# Patient Record
Sex: Male | Born: 1990 | Race: Black or African American | Hispanic: No | Marital: Single | Smoking: Current some day smoker
Health system: Southern US, Community
[De-identification: ages and names within clinical notes are randomized; demographics above are authoritative.]

## PROBLEM LIST (undated history)

## (undated) DIAGNOSIS — J45909 Unspecified asthma, uncomplicated: Secondary | ICD-10-CM

## (undated) HISTORY — PX: KNEE ARTHROSCOPY: SUR90

---
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
## 2010-08-24 NOTE — Op Note (Signed)
NAMECARMINO, OCAIN                 ACCOUNT NO.:  [id number]   MEDICAL RECORD NO.:  [id number]          PATIENT TYPE:  AMB   LOCATION:  DSC                          FACILITY:  [facility]   PHYSICIAN:  Loreta Ave, M.D. DATE OF BIRTH:  Jun 23, 1990   DATE OF PROCEDURE:  02/14/2007  DATE OF DISCHARGE:                               OPERATIVE REPORT   PREOPERATIVE DIAGNOSIS:  Right knee anterior cruciate ligament tear with  anterolateral rotary instability.  Lateral meniscus tear.   POSTOPERATIVE DIAGNOSIS:  Right knee anterior cruciate ligament tear  with anterolateral rotary instability.  Lateral meniscus complex tear  irreparable.   PROCEDURE:  Right knee exam under anesthesia, arthroscopy, partial  lateral meniscectomy.  Arthroscopic endoscopic anterior cruciate  ligament reconstruction patellar tendon autograft, bone tendon bone.  Notchplasty.  Bioabsorbable screw fixation.   SURGEON:  Loreta Ave, MD   ASSISTANT:  Zonia Kief, PA present throughout the entire case.   ANESTHESIA:  General.   BLOOD LOSS:  Minimal.   SPECIMENS:  None.   CULTURES:  None.   COMPLICATIONS:  None.   PROCEDURE:  Soft compressive with knee immobilizer.   TOURNIQUET TIME:  90 minutes.   PROCEDURE:  The patient brought to the operating room, placed on  operating table in supine position.  After adequate anesthesia been  obtained, right knee examined.  Full motion.  Positive Lachman, positive  drawer, positive pivot shift.  PCL collaterals stable.  Patellofemoral  joint stable.  Tourniquet applied.  Prepped and draped in usual sterile  fashion.  Exsanguinated with elevation and Esmarch tourniquet to 350  mmHg.  Three portals created, one superolateral, one each medial and  lateral parapatellar.  Inflow catheter introduced, knee distended,  arthroscope introduced, knee inspected.  Articular cartilage intact  throughout.  Good patellofemoral tracking.  Complete mid substance  irreparable ACL  tear.  Moderately narrow notch.  PCL intact.  Medial  meniscus, medial compartment normal.  Lateral meniscus marked complex  tearing with numerous radial tears throughout, as well as peripheral  tears.  Thoroughly assessed completely irreparable.  Saucerized out  leaving a little bit of the rim in the back tapered into remaining  meniscus, salvaging most of the anterior half.  Notch cleared.  Notchplasty with shaver and high-speed bur.  Instruments and fluid  removed.  Anterior incision patella to tibial tubercle.  Middle third  patellar tendon harvested with bone pegs at either end.  Defect closed  with 0 Vicryl.  Graft prepared for 10-mm tunnels.  Arthroscope  reintroduced.  Small incision medial to the tibial tubercle.  K-wire  driven exiting out to the middle of the footprint of the ACL on the  tibia.  Overdrilled with a 10-mm reamer.  Debris cleared with shaver.  Femoral guide inserted across tibial tunnel notch and out back cortex of  femur.  K-wire driven.  Overdrilled with 10-mm reamer for appropriate  depth of the graft and pegs.  All recess examined, all debris cleared.  Both tunnels assessed and found to be in good position.  Two pin passer  inserted across both tunnels  out through a stab wound anterolateral  side. Nitinol wire brought in the medial portal and out to the femoral  tunnel.  Graft attached to two pin passer brought in across the knee  seating the pegs well in both the tibial and femoral tunnels.  Firmly  fixed at both ends with isometric tensioning technique at 70 degrees of  flexion placing bioabsorbable screws above and below.  9 x 25 mm above,  9 x 25 mm below.  Good capture and fixation.  All nitinol wire sutures  then removed.  Knee examined.  Full motion.  No impingement of the graft  when viewed through full motion.  Negative Lachman, negative drawer with  nice firm endpoint.  Wounds were all irrigated.  Incisions closed with  subcutaneous and subcuticular  Vicryl.  Portals closed with nylon.  Sterile compressive dressing applied after knee injected with Marcaine.  Tourniquet deflated and removed.  Knee immobilizer applied.  Anesthesia  reversed.  Brought to recovery room.  Tolerated surgery well.  No  complications.      Loreta Ave, M.D.  Electronically Signed     DFM/MEDQ  D:  02/14/2007  T:  02/15/2007  Job:  [id number]

## 2011-01-18 LAB — POCT HEMOGLOBIN-HEMACUE
Hemoglobin: 15.6
Operator id: 123881

## 2012-02-26 ENCOUNTER — Emergency Department (HOSPITAL_COMMUNITY): Payer: Self-pay

## 2012-02-26 ENCOUNTER — Encounter (HOSPITAL_COMMUNITY): Payer: Self-pay

## 2012-02-26 ENCOUNTER — Emergency Department (HOSPITAL_COMMUNITY)
Admission: EM | Admit: 2012-02-26 | Discharge: 2012-02-26 | Disposition: A | Discharge status: Home / Self Care | Payer: Self-pay | Attending: Emergency Medicine | Admitting: Emergency Medicine

## 2012-02-26 DIAGNOSIS — R51 Headache: Secondary | ICD-10-CM | POA: Insufficient documentation

## 2012-02-26 DIAGNOSIS — T148XXA Other injury of unspecified body region, initial encounter: Secondary | ICD-10-CM | POA: Insufficient documentation

## 2012-02-26 DIAGNOSIS — IMO0002 Reserved for concepts with insufficient information to code with codable children: Secondary | ICD-10-CM | POA: Insufficient documentation

## 2012-02-26 DIAGNOSIS — Z23 Encounter for immunization: Secondary | ICD-10-CM | POA: Insufficient documentation

## 2012-02-26 DIAGNOSIS — L089 Local infection of the skin and subcutaneous tissue, unspecified: Secondary | ICD-10-CM | POA: Insufficient documentation

## 2012-02-26 DIAGNOSIS — Y939 Activity, unspecified: Secondary | ICD-10-CM | POA: Insufficient documentation

## 2012-02-26 DIAGNOSIS — Y92410 Unspecified street and highway as the place of occurrence of the external cause: Secondary | ICD-10-CM | POA: Insufficient documentation

## 2012-02-26 DIAGNOSIS — S0180XA Unspecified open wound of other part of head, initial encounter: Secondary | ICD-10-CM | POA: Insufficient documentation

## 2012-02-26 MED ORDER — BACITRACIN ZINC 500 UNIT/GM EX OINT: TOPICAL_OINTMENT | Freq: Two times a day (BID) | CUTANEOUS | Status: DC

## 2012-02-26 MED ORDER — TETANUS-DIPHTH-ACELL PERTUSSIS 5-2.5-18.5 LF-MCG/0.5 IM SUSP
0.5000 mL | Freq: Once | INTRAMUSCULAR | Status: AC
  Administered 2012-02-26: 0.5 mL via INTRAMUSCULAR
  Filled 2012-02-26: qty 0.5

## 2012-02-26 MED ORDER — IBUPROFEN 600 MG PO TABS: 600.0000 mg | ORAL_TABLET | Freq: Four times a day (QID) | ORAL | Status: DC | PRN

## 2012-02-26 MED ORDER — TRAMADOL HCL 50 MG PO TABS: 50.0000 mg | ORAL_TABLET | Freq: Four times a day (QID) | ORAL | Status: DC | PRN

## 2012-02-26 MED ORDER — OXYCODONE-ACETAMINOPHEN 5-325 MG PO TABS
1.0000 | ORAL_TABLET | Freq: Once | ORAL | Status: AC
  Administered 2012-02-26: 1 via ORAL
  Filled 2012-02-26: qty 1
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
## 2012-02-26 NOTE — ED Notes (Signed)
Pt back from radiology.  AO x 4.

## 2012-02-26 NOTE — ED Notes (Signed)
Discharge instructions reviewed. Pt verbalized understanding.  

## 2012-02-26 NOTE — ED Provider Notes (Signed)
LACERATION REPAIR Performed by: Jaci Carrel Authorized by: Jaci Carrel Consent: Verbal consent obtained. Risks and benefits: risks, benefits and alternatives were discussed Consent given by: patient Patient identity confirmed: provided demographic data Prepped and Draped in normal sterile fashion Wound explored  Laceration Location: Forehead  Laceration Length: 3cm  No Foreign Bodies seen or palpated  Anesthesia: local infiltration  Local anesthetic: lidocaine 1% w epinephrine  Anesthetic total: 2 ml  Irrigation method: syringe Amount of cleaning: standard  Skin closure: 6.0 prolene  Number of sutures: 5  Technique: simple interuped  Patient tolerance: Patient tolerated the procedure well with no immediate complications.   Jaci Carrel, [place] 02/26/12 [phone]

## 2012-02-26 NOTE — ED Provider Notes (Signed)
I supervised this laceration repair.   Brandt Loosen, MD 02/26/12 [phone]

## 2012-02-26 NOTE — ED Notes (Signed)
Pt to CT and XRay
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
## 2013-11-06 ENCOUNTER — Emergency Department (HOSPITAL_COMMUNITY)
Admission: EM | Admit: 2013-11-06 | Discharge: 2013-11-06 | Disposition: A | Discharge status: Home / Self Care | Payer: Self-pay | Attending: Emergency Medicine | Admitting: Emergency Medicine

## 2013-11-06 ENCOUNTER — Encounter (HOSPITAL_COMMUNITY): Payer: Self-pay | Admitting: Emergency Medicine

## 2013-11-06 DIAGNOSIS — Z791 Long term (current) use of non-steroidal anti-inflammatories (NSAID): Secondary | ICD-10-CM | POA: Insufficient documentation

## 2013-11-06 DIAGNOSIS — L02419 Cutaneous abscess of limb, unspecified: Secondary | ICD-10-CM | POA: Insufficient documentation

## 2013-11-06 DIAGNOSIS — L03115 Cellulitis of right lower limb: Secondary | ICD-10-CM

## 2013-11-06 DIAGNOSIS — Z792 Long term (current) use of antibiotics: Secondary | ICD-10-CM | POA: Insufficient documentation

## 2013-11-06 DIAGNOSIS — M7989 Other specified soft tissue disorders: Secondary | ICD-10-CM | POA: Insufficient documentation

## 2013-11-06 DIAGNOSIS — L03119 Cellulitis of unspecified part of limb: Principal | ICD-10-CM

## 2013-11-06 MED ORDER — CEPHALEXIN 250 MG PO CAPS
500.0000 mg | ORAL_CAPSULE | Freq: Once | ORAL | Status: AC
  Administered 2013-11-06: 500 mg via ORAL
  Filled 2013-11-06: qty 2

## 2013-11-06 MED ORDER — CEPHALEXIN 500 MG PO CAPS: 500.0000 mg | ORAL_CAPSULE | Freq: Four times a day (QID) | ORAL | Status: DC

## 2013-11-06 NOTE — ED Notes (Signed)
Small pimple with large area of redness around it. Onset last pm. States started "picking at it" last pm.

## 2013-11-06 NOTE — ED Provider Notes (Signed)
Medical screening examination/treatment/procedure(s) were performed by non-physician practitioner and as supervising physician I was immediately available for consultation/collaboration.   EKG Interpretation None        William Shawnee Higham, MD 11/06/13 1531 

## 2013-11-06 NOTE — Discharge Instructions (Signed)
Please read and follow all provided instructions.  Your diagnoses today include:  1. Cellulitis of right lower extremity    Tests performed today include:  Vital signs. See below for your results today.   Medications prescribed:   Keflex (cephalexin) - antibiotic  You have been prescribed an antibiotic medicine: take the entire course of medicine even if you are feeling better. Stopping early can cause the antibiotic not to work.  Take any prescribed medications only as directed.   Home care instructions:  Follow any educational materials contained in this packet. Keep affected area above the level of your heart when possible. Wash area gently twice a day with warm soapy water. Do not apply alcohol or hydrogen peroxide. Cover the area if it draining or weeping.   Follow-up instructions: Return to the Emergency Department in 48 hours for a recheck if your symptoms are not significantly improved.   Return instructions:  Return to the Emergency Department if you have:  Fever  Worsening symptoms  Worsening pain  Worsening swelling  Redness of the skin that moves away from the affected area, especially if it streaks away from the affected area   Any other emergent concerns  Your vital signs today were: BP 134/74   Pulse 81   Temp(Src) 98.2 F (36.8 C) (Oral)   Resp 18   Ht 6' (1.829 m)   Wt 192 lb (87.091 kg)   BMI 26.03 kg/m2   SpO2 96% If your blood pressure (BP) was elevated above 135/85 this visit, please have this repeated by your doctor within one month. --------------

## 2013-11-06 NOTE — ED Notes (Signed)
Reports possible bug bite to right lower leg since yesterday.

## 2013-11-06 NOTE — ED Provider Notes (Signed)
CSN: [id number]     Arrival date & time 11/06/13  1305 History  This chart was scribed for non-physician practitioner, Renne Crigler, PA-C, working with Dagmar Hait, MD by Charline Bills, ED Scribe. This patient was seen in room TR05C/TR05C and the patient's care was started at 2:46 PM.   Chief Complaint  Patient presents with  . Insect Bite   The history is provided by the patient. No language interpreter was used.   HPI Comments: Kirk Rose is a 23 y.o. male who presents to the Emergency Department complaining of suspected insect bite to R lower leg noted last night. Pt reports associated gradually worsening pain, redness and warmth to the area. He denies fever, chills, nausea, vomiting. Pt has applied peroxide and alcohol with no relief.   History reviewed. No pertinent past medical history. History reviewed. No pertinent past surgical history. History reviewed. No pertinent family history. History  Substance Use Topics  . Smoking status: Not on file  . Smokeless tobacco: Not on file  . Alcohol Use: Yes     Comment: occasionally    Review of Systems  Constitutional: Negative for fever and chills.  Cardiovascular: Positive for leg swelling.  Gastrointestinal: Negative for nausea and vomiting.  Skin: Positive for color change and wound.   Allergies  Review of patient's allergies indicates no known allergies.  Home Medications   Prior to Admission medications   Medication Sig Start Date End Date Taking? Authorizing Provider  bacitracin ointment Apply topically 2 (two) times daily. 02/26/12   Brandt Loosen, MD  ibuprofen (ADVIL,MOTRIN) 600 MG tablet Take 1 tablet (600 mg total) by mouth every 6 (six) hours as needed for pain. 02/26/12   Brandt Loosen, MD  Pseudoeph-Doxylamine-DM-APAP (NYQUIL PO) Take 2 capsules by mouth every 6 (six) hours as needed. Cold symptoms    Historical Provider, MD  traMADol (ULTRAM) 50 MG tablet Take 1 tablet (50 mg total) by mouth every 6  (six) hours as needed for pain. 02/26/12   Brandt Loosen, MD   Triage Vitals: BP 134/74  Pulse 81  Temp(Src) 98.2 F (36.8 C) (Oral)  Resp 18  Ht 6' (1.829 m)  Wt 192 lb (87.091 kg)  BMI 26.03 kg/m2  SpO2 96%  Physical Exam  Nursing note and vitals reviewed. Constitutional: He appears well-developed and well-nourished.  HENT:  Head: Normocephalic and atraumatic.  Eyes: Conjunctivae are normal.  Neck: Normal range of motion. Neck supple.  Pulmonary/Chest: Effort normal. No respiratory distress.  Neurological: He is alert.  Skin: Skin is warm and dry.  Patients with 5 cm diameter area of erythema and warmth surrounding a small pustule on his right lateral ankle consistent with cellulitis. No palpable abscess.  Psychiatric: He has a normal mood and affect. His behavior is normal.   ED Course  Procedures (including critical care time) DIAGNOSTIC STUDIES: Oxygen Saturation is 96% on RA, adequate by my interpretation.    COORDINATION OF CARE: 2:51 PM-Discussed treatment plan which includes antibiotics and return precautions with pt at bedside and pt agreed to plan.   Labs Review Labs Reviewed - No data to display  Imaging Review No results found.   EKG Interpretation None      Vital signs reviewed and are as follows: Filed Vitals:   11/06/13 1323  BP: 134/74  Pulse: 81  Temp: 98.2 F (36.8 C)  Resp: 18   Pt urged to return with worsening pain, worsening swelling, expanding area of redness or streaking up extremity,  fever, or any other concerns. Urged to take complete course of antibiotics as prescribed.  Counseled to take NSAIDs for pain. Pt verbalizes understanding and agrees with plan.   MDM   Final diagnoses:  Cellulitis of right lower extremity   Patient with area of cellulitis on lower extremity. No systemic symptoms. Compartments of lower extremity are soft. Treat with Keflex. No injury to the area.  I personally performed the services described in this  documentation, which was scribed in my presence. The recorded information has been reviewed and is accurate.    Renne CriglerJoshua Annalynn Centanni, PA-C 11/06/13 1501

## 2014-11-30 ENCOUNTER — Emergency Department (HOSPITAL_COMMUNITY)
Admission: EM | Admit: 2014-11-30 | Discharge: 2014-11-30 | Disposition: A | Discharge status: Home / Self Care | Payer: Self-pay | Attending: Emergency Medicine | Admitting: Emergency Medicine

## 2014-11-30 ENCOUNTER — Encounter (HOSPITAL_COMMUNITY): Payer: Self-pay | Admitting: *Deleted

## 2014-11-30 DIAGNOSIS — Z8739 Personal history of other diseases of the musculoskeletal system and connective tissue: Secondary | ICD-10-CM | POA: Insufficient documentation

## 2014-11-30 DIAGNOSIS — Z792 Long term (current) use of antibiotics: Secondary | ICD-10-CM | POA: Insufficient documentation

## 2014-11-30 DIAGNOSIS — Z720 Tobacco use: Secondary | ICD-10-CM | POA: Insufficient documentation

## 2014-11-30 DIAGNOSIS — L03116 Cellulitis of left lower limb: Secondary | ICD-10-CM | POA: Insufficient documentation

## 2014-11-30 MED ORDER — SULFAMETHOXAZOLE-TRIMETHOPRIM 800-160 MG PO TABS: 1.0000 | ORAL_TABLET | Freq: Two times a day (BID) | ORAL | Status: AC

## 2014-11-30 NOTE — Discharge Instructions (Signed)
Please read and follow all provided instructions.  Your diagnoses today include:  1. Cellulitis of left lower extremity     Tests performed today include:  Vital signs. See below for your results today.   Medications prescribed:   Take any prescribed medications only as directed.   Home care instructions:   Follow any educational materials contained in this packet  Follow-up instructions: Return to the Emergency Department in 48 hours for a recheck if your symptoms are not significantly improved.  Return instructions:  Return to the Emergency Department if you have:  Fever  Worsening symptoms  Worsening pain  Worsening swelling  Redness of the skin that moves away from the affected area, especially if it streaks away from the affected area   Any other emergent concerns  Your vital signs today were: BP 138/77 mmHg   Pulse 77   Temp(Src) 98 F (36.7 C) (Oral)   Resp 16   Ht 6' (1.829 m)   Wt 195 lb (88.451 kg)   BMI 26.44 kg/m2   SpO2 100% If your blood pressure (BP) was elevated above 135/85 this visit, please have this repeated by your doctor within one month. --------------

## 2014-11-30 NOTE — ED Notes (Signed)
Possible spider bite to posterior ,upper LT thigh. Skin red swollen ,no drainage seen.

## 2014-11-30 NOTE — ED Notes (Signed)
Declined W/C at D/C and was escorted to lobby by RN. 

## 2014-11-30 NOTE — ED Provider Notes (Signed)
CSN: [id number]     Arrival date & time 11/30/14  0929 History  This chart was scribed for Renne Crigler, PA-C, working with Pricilla Loveless, MD by Elon Spanner, ED Scribe. This patient was seen in room TR10C/TR10C and the patient's care was started at 9:51 AM.   Chief Complaint  Patient presents with  . Insect Bite   The history is provided by the patient. No language interpreter was used.    HPI Comments: Kirk Rose is a 24 y.o. male who presents to the Emergency Department complaining of a suspected spider bite (not visualized) on the left upper thigh onset several days ago. Associated symptoms include a small area of surrounding redness but no drainage. No fever, N/V. Patient reports a hx of cellulitis on the right calf that required antibiotics. NKA.  Past Medical History  Diagnosis Date  . Arthritis childhood   Past Surgical History  Procedure Laterality Date  . Knee arthroscopy Right    History reviewed. No pertinent family history. Social History  Substance Use Topics  . Smoking status: Current Some Day Smoker    Types: Cigarettes  . Smokeless tobacco: Never Used  . Alcohol Use: Yes     Comment: occasionally    Review of Systems  Constitutional: Negative for fever.  Gastrointestinal: Negative for nausea and vomiting.  Skin: Positive for color change and wound.       Positive for abscess  Hematological: Negative for adenopathy.    Allergies  Review of patient's allergies indicates no known allergies.  Home Medications   Prior to Admission medications   Medication Sig Start Date End Date Taking? Authorizing Provider  cephALEXin (KEFLEX) 500 MG capsule Take 1 capsule (500 mg total) by mouth 4 (four) times daily. 11/06/13   Renne Crigler, PA-C   BP 138/77 mmHg  Pulse 77  Temp(Src) 98 F (36.7 C) (Oral)  Resp 16  Ht 6' (1.829 m)  Wt 195 lb (88.451 kg)  BMI 26.44 kg/m2  SpO2 100%   Physical Exam  Constitutional: He appears well-developed and  well-nourished.  HENT:  Head: Normocephalic and atraumatic.  Eyes: Conjunctivae are normal.  Neck: Normal range of motion. Neck supple.  Pulmonary/Chest: No respiratory distress.  Neurological: He is alert.  Skin: Skin is warm and dry. There is erythema.  Less then 1 cm pustule noted to left posterior thigh with 3-4 cm of surrounding erythema consistent with cellulitis.  Psychiatric: He has a normal mood and affect.  Nursing note and vitals reviewed.   ED Course  Procedures (including critical care time)  DIAGNOSTIC STUDIES: Oxygen Saturation is 100% on RA, normal by my interpretation.    COORDINATION OF CARE:  9:56 AM Discussed with patient that there was no indication for I&D at this time.  Will prescribe Bactrim.  Patient should soak the complaint and use warm compress.  Return precautions advised.  He may need to return if abscess forms. Patient acknowledges and agrees with plan.     EKG Interpretation None       Vital signs reviewed and are as follows: Filed Vitals:   11/30/14 0936  BP: 138/77  Pulse: 77  Temp: 98 F (36.7 C)  Resp: 16   The patient was urged to return to the Emergency Department urgently with worsening pain, swelling, expanding erythema especially if it streaks away from the affected area, fever, or if they have any other concerns.   The patient was urged to return to the Emergency Department or go  to their PCP in 48 hours for wound recheck if the area is not significantly improved.  The patient verbalized understanding and stated agreement with this plan.     MDM   Final diagnoses:  Cellulitis of left lower extremity   Patient with small pustule with associated cellulitis. Will start on Bactrim. Return instructions discussed as above.  I personally performed the services described in this documentation, which was scribed in my presence. The recorded information has been reviewed and is accurate.    Renne Crigler, PA-C 11/30/14  1019  Pricilla Loveless, MD 12/04/14 [phone]

## 2015-06-01 ENCOUNTER — Encounter (HOSPITAL_COMMUNITY): Payer: Self-pay | Admitting: *Deleted

## 2015-06-01 ENCOUNTER — Emergency Department (HOSPITAL_COMMUNITY)
Admission: EM | Admit: 2015-06-01 | Discharge: 2015-06-01 | Disposition: A | Discharge status: Home / Self Care | Payer: Self-pay | Attending: Emergency Medicine | Admitting: Emergency Medicine

## 2015-06-01 DIAGNOSIS — J069 Acute upper respiratory infection, unspecified: Secondary | ICD-10-CM | POA: Insufficient documentation

## 2015-06-01 DIAGNOSIS — J45909 Unspecified asthma, uncomplicated: Secondary | ICD-10-CM | POA: Insufficient documentation

## 2015-06-01 DIAGNOSIS — F17210 Nicotine dependence, cigarettes, uncomplicated: Secondary | ICD-10-CM | POA: Insufficient documentation

## 2015-06-01 HISTORY — DX: Unspecified asthma, uncomplicated: J45.909

## 2015-06-01 MED ORDER — FLUTICASONE PROPIONATE 50 MCG/ACT NA SUSP: 2.0000 | Freq: Every day | NASAL | Status: DC

## 2015-06-01 MED ORDER — BENZONATATE 100 MG PO CAPS: 100.0000 mg | ORAL_CAPSULE | Freq: Three times a day (TID) | ORAL | Status: DC

## 2015-06-01 NOTE — ED Notes (Signed)
Declined W/C at D/C and was escorted to lobby by RN. 

## 2015-06-01 NOTE — ED Notes (Signed)
Pt reports a productive cough and sore throat since Friday.

## 2015-06-01 NOTE — ED Provider Notes (Signed)
CSN: [id number]     Arrival date & time 06/01/15  0804 History  By signing my name below, I, Kirk Rose, attest that this documentation has been prepared under the direction and in the presence of [facility], PA-C. Electronically Signed: Ronney Rose, ED Scribe. 06/01/2015. 9:48 AM.    Chief Complaint  Patient presents with  . Cough  . Sore Throat   The history is provided by the patient. No language interpreter was used.    HPI Comments: Kirk Rose is a 25 y.o. male who presents to the Emergency Department complaining of a gradual-onset, constant, moderate, dry, hacking cough that began 3 days ago. He also notes associated congestion, sore throat, and chills. He states his cough is worse at night, which is interfering with his sleep. Patient notes a history of asthma as a child, but states he has not had any asthmatic symptoms in the past 10 years. He reports he had tried Mucinex with only minimal relief to his symptoms. He denies any known fever.   Past Medical History  Diagnosis Date  . Asthma childhood   Past Surgical History  Procedure Laterality Date  . Knee arthroscopy Right    History reviewed. No pertinent family history. Social History  Substance Use Topics  . Smoking status: Current Some Day Smoker    Types: Cigarettes  . Smokeless tobacco: Never Used  . Alcohol Use: Yes     Comment: occasionally    Review of Systems  Constitutional: Positive for chills. Negative for fever.  HENT: Positive for congestion and sore throat.   Respiratory: Positive for cough.    Allergies  Review of patient's allergies indicates no known allergies.  Home Medications   Prior to Admission medications   Medication Sig Start Date End Date Taking? Authorizing Provider  benzonatate (TESSALON) 100 MG capsule Take 1 capsule (100 mg total) by mouth every 8 (eight) hours. 06/01/15   Chase Picket Selene Peltzer, PA-C  fluticasone (FLONASE) 50 MCG/ACT nasal spray Place 2 sprays into both nostrils  daily. 06/01/15   Sarah Baez Pilcher Freddie Dymek, PA-C   BP 126/90 mmHg  Pulse 76  Temp(Src) 98 F (36.7 C) (Oral)  Resp 20  Ht 6' (1.829 m)  Wt 90.719 kg  BMI 27.12 kg/m2  SpO2 98% Physical Exam  Constitutional: He is oriented to person, place, and time. He appears well-developed and well-nourished. No distress.  HENT:  Head: Normocephalic and atraumatic.  Mouth/Throat: Posterior oropharyngeal erythema present. No oropharyngeal exudate.  Oropharynx with erythema. No exudates, no tonsillar hypertrophy. Nasal congestion.   Eyes: Conjunctivae and EOM are normal.  Neck: Neck supple. No tracheal deviation present.  Cardiovascular: Normal rate, regular rhythm and normal heart sounds.   Pulmonary/Chest: Effort normal and breath sounds normal. No respiratory distress. He has no wheezes. He has no rales. He exhibits no tenderness.  Lungs are clear to auscultation.   Abdominal: He exhibits no distension. There is no tenderness.  Musculoskeletal: Normal range of motion.  Lymphadenopathy:    He has cervical adenopathy.  Neurological: He is alert and oriented to person, place, and time.  Skin: Skin is warm and dry.  Psychiatric: He has a normal mood and affect. His behavior is normal.  Nursing note and vitals reviewed.   ED Course  Procedures (including critical care time)  DIAGNOSTIC STUDIES: Oxygen Saturation is 98% on RA, normal by my interpretation.    COORDINATION OF CARE: 9:38 AM - Patients symptoms are consistent with URI, likely viral etiology. Discussed that  antibiotics are not indicated for viral infections. Pt will be discharged with symptomatic treatment. Discussed treatment plan with pt at bedside which includes cough medication and Flonase. Advised OTC decongestant, lozenges, hot teas, and saltwater gargles. Pt is hemodynamically stable & in NAD prior to dc. Pt verbalized understanding and agreed to plan.   MDM   Final diagnoses:  URI (upper respiratory infection)   [name] is afebrile, non-toxic appearing with a clear lung exam. Mild rhinorrhea and OP with erythema, no exudates. Likely viral URI. Patient is agreeable to symptomatic treatment with close follow up with PCP as needed but spoke at length about emergent, changing, or worsening of symptoms that should prompt return to ER. Patient voices understanding and is agreeable to plan.   Blood pressure 126/90, pulse 76, temperature 98 F (36.7 C), temperature source Oral, resp. rate 20, height 6' (1.829 m), weight 90.719 kg, SpO2 98 %.  I personally performed the services described in this documentation, which was scribed in my presence. The recorded information has been reviewed and is accurate.  [facility] Krystin Keeven, PA-C 06/01/15 9629  Lorre Nick, MD 06/01/15 1330

## 2015-06-01 NOTE — Discharge Instructions (Signed)
1. Medications: flonase, decongestant over the counter, tessalon for cough, usual home medications 2. Treatment: rest, drink plenty of fluids, take tylenol or ibuprofen for fever control as needed 3. Follow Up: Please followup with your primary doctor in 3 days for discussion of your diagnoses and further evaluation after today's visit;  if you do not have a primary care doctor use the resource guide provided to find one; Return to the ER for high fevers, difficulty breathing or other concerning symptoms   Emergency Department Resource Guide  1) Find a Doctor and Pay Out of Pocket Although you won't have to find out who is covered by your insurance plan, it is a good idea to ask around and get recommendations. You will then need to call the office and see if the doctor you have chosen will accept you as a new patient and what types of options they offer for patients who are self-pay. Some doctors offer discounts or will set up payment plans for their patients who do not have insurance, but you will need to ask so you aren't surprised when you get to your appointment.  2) Contact Your Local Health Department Not all health departments have doctors that can see patients for sick visits, but many do, so it is worth a call to see if yours does. If you don't know where your local health department is, you can check in your phone book. The CDC also has a tool to help you locate your state's health department, and many state websites also have listings of all of their local health departments.  3) Find a Walk-in Clinic If your illness is not likely to be very severe or complicated, you may want to try a walk in clinic. These are popping up all over the country in pharmacies, drugstores, and shopping centers. They're usually staffed by nurse practitioners or physician assistants that have been trained to treat common illnesses and complaints. They're usually fairly quick and inexpensive. However, if you have  serious medical issues or chronic medical problems, these are probably not your best option.  No Primary Care Doctor: - Call Health Connect at  [phone]: they can help you locate a primary care doctor that  accepts your insurance, provides certain services, etc. - Physician Referral Service: [phone]  Chronic Pain Problems: Organization         Address  Phone   Notes  [facility]  [phone] Patients need to be referred by their primary care doctor.   Medication Assistance: Organization         Address  Phone   Notes  [facility] Medication [facility] [address] [place] [place], [place] [address] [phone] --Must be a resident of [facility] -- Must have NO insurance coverage whatsoever (no Medicaid/ Medicare, etc.) -- The pt. MUST have a primary care doctor that directs their care regularly and follows them in the community   [facility]  [phone]   [employer]  [phone]    Agencies that provide inexpensive medical care: Organization         Address  Phone   Notes  [facility]  [phone]   [facility]    [phone]   [facility] [address] [place], [place] [address] [phone]   [facility] [address] [place], [place] [phone]   [facility]anned [facility]    [phone]   [facility]    [phone]   [facility]  [address], [place] Phone:  [phone], Fax:  [phone] Hours of Operation:  9 am - 6 pm, M-F.  Also accepts Medicaid/Medicare and self-pay.  [facility]  [address], [place] Phone: [phone], Fax: [phone]. Hours of Operation:  8:30 am - 5:30 pm, M-F.  Also accepts Medicaid and self-pay.  [facility] [place] [address], [place] Phone: [phone]   [facility] [address] Natasha Bence [place], [place] [phone], Ext. [phone] Mondays & Thursdays: 7-9 AM.  First 15 patients are seen on a first come, first serve basis.    Medicaid-accepting [facility] Providers:  Organization         Address  Phone   Notes  [facility] [address], [place] [phone] Also accepts self-pay patients.  [facility] [address] Laurell Josephs [place], [place]  [phone]   [facility] [address], [place] [phone]   [facility] [address], [place] [phone]   Renaye Rakers [address], [place]   [phone] Only accepts [place] patients after they have their name applied to their card.   Self-Pay (no insurance) in [facility]:  Organization         Address  Phone   Notes  Sickle Cell Patients, [facility] [address] [place], [place] [phone]   [facility] [address] [place], [place] [phone]   [facility]  [address], [place] [phone]   [facility]/Dr. Osei-Bonsu  [address] [place] or [address], [place] [phone] Phone number for both [place] and [place] locations is the same.  [facility] [address], [place] [phone]   [facility] [address] [place] or [address] [phone]   [facility] [address] [place] [phone], phone; [phone], fax Sees patients 1st and 3rd Saturday of every month.  Must not qualify for public or private insurance (i.e. Medicaid, Medicare, [place] Health Choice, Veterans' Benefits)  Household income should be no more than 200% of the poverty level The clinic cannot treat you if you are pregnant or think you are pregnant   Sexually transmitted diseases are not treated at the clinic.

## 2016-12-19 ENCOUNTER — Encounter (HOSPITAL_COMMUNITY): Payer: Self-pay | Admitting: Emergency Medicine

## 2016-12-19 DIAGNOSIS — Z79899 Other long term (current) drug therapy: Secondary | ICD-10-CM | POA: Insufficient documentation

## 2016-12-19 DIAGNOSIS — F17210 Nicotine dependence, cigarettes, uncomplicated: Secondary | ICD-10-CM | POA: Insufficient documentation

## 2016-12-19 DIAGNOSIS — L723 Sebaceous cyst: Secondary | ICD-10-CM | POA: Insufficient documentation

## 2016-12-19 DIAGNOSIS — R51 Headache: Secondary | ICD-10-CM | POA: Insufficient documentation

## 2016-12-19 NOTE — ED Triage Notes (Signed)
[facility] and noticed two knots on the back of his head. Patient states it is causing discomfort and pain.

## 2016-12-20 ENCOUNTER — Emergency Department (HOSPITAL_COMMUNITY)
Admission: EM | Admit: 2016-12-20 | Discharge: 2016-12-20 | Disposition: A | Discharge status: Home / Self Care | Payer: Self-pay | Attending: Emergency Medicine | Admitting: Emergency Medicine

## 2016-12-20 DIAGNOSIS — R519 Headache, unspecified: Secondary | ICD-10-CM

## 2016-12-20 DIAGNOSIS — R51 Headache: Secondary | ICD-10-CM

## 2016-12-20 DIAGNOSIS — L723 Sebaceous cyst: Secondary | ICD-10-CM

## 2016-12-20 MED ORDER — KETOROLAC TROMETHAMINE 60 MG/2ML IM SOLN
60.0000 mg | Freq: Once | INTRAMUSCULAR | Status: AC
  Administered 2016-12-20: 60 mg via INTRAMUSCULAR
  Filled 2016-12-20: qty 2

## 2016-12-20 MED ORDER — IBUPROFEN 600 MG PO TABS: 600.0000 mg | ORAL_TABLET | Freq: Four times a day (QID) | ORAL | 0 refills | Status: DC | PRN

## 2016-12-20 MED ORDER — DOXYCYCLINE HYCLATE 100 MG PO CAPS: 100.0000 mg | ORAL_CAPSULE | Freq: Two times a day (BID) | ORAL | 0 refills | Status: AC

## 2016-12-20 MED ORDER — DOXYCYCLINE HYCLATE 100 MG PO TABS
100.0000 mg | ORAL_TABLET | Freq: Once | ORAL | Status: AC
  Administered 2016-12-20: 100 mg via ORAL
  Filled 2016-12-20: qty 1

## 2016-12-20 NOTE — Discharge Instructions (Signed)
We believe that your headache is brought on by presence of a sebaceous cyst to your scalp. One of the cysts may be infected which can cause it to be tender. We advise that you take doxycycline to prevent worsening symptoms and worsening infection. Take ibuprofen as prescribed for pain management. You may try applying warm compresses to the area to promote drainage. Follow-up with a primary care doctor if able. You may return to the emergency department for new or concerning symptoms.

## 2016-12-30 NOTE — ED Provider Notes (Signed)
[facility]-EMERGENCY DEPT Provider Note   CSN: [id number] Arrival date & time: 12/19/16  1950     History   Chief Complaint Chief Complaint  Patient presents with  . Headache    HPI Kirk Rose is a 26 y.o. male.  26 year old male presents to the emergency department for evaluation of knots to the back of his head. He states that he has had these over the past few days. They have been causing him a mild headache. He denies taking any medications for his symptoms. No associated fevers, trauma, drainage from the area. He states that he had similar symptoms previously which resolved spontaneously.   The history is provided by the patient. No language interpreter was used.  Headache      Past Medical History:  Diagnosis Date  . Asthma childhood    There are no active problems to display for this patient.   Past Surgical History:  Procedure Laterality Date  . KNEE ARTHROSCOPY Right       Home Medications    Prior to Admission medications   Medication Sig Start Date End Date Taking? Authorizing Provider  acetaminophen (TYLENOL) 500 MG tablet Take 1,000 mg by mouth every 6 (six) hours as needed for mild pain, moderate pain or headache.   Yes [provider]  doxycycline (VIBRAMYCIN) 100 MG capsule Take 1 capsule (100 mg total) by mouth 2 (two) times daily. Take this medication with a full glass of milk or water 12/20/16   Antony Madura, PA-C  ibuprofen (ADVIL,MOTRIN) 600 MG tablet Take 1 tablet (600 mg total) by mouth every 6 (six) hours as needed. 12/20/16   Antony Madura, PA-C    Family History History reviewed. No pertinent family history.  Social History Social History  Substance Use Topics  . Smoking status: Current Some Day Smoker    Packs/day: 0.20    Years: 3.00    Types: Cigarettes  . Smokeless tobacco: Never Used  . Alcohol use Yes     Comment: occasionally     Allergies   Patient has no known allergies.   Review of Systems Review of  Systems  Neurological: Positive for headaches.   Ten systems reviewed and are negative for acute change, except as noted in the HPI.    Physical Exam Updated Vital Signs BP (!) 152/92 (BP Location: Right Arm)   Pulse 61   Temp 97.8 F (36.6 C) (Oral)   Resp 16   Ht 6' (1.829 m)   Wt 90.7 kg (200 lb)   SpO2 100%   BMI 27.12 kg/m   Physical Exam  Constitutional: He is oriented to person, place, and time. He appears well-developed and well-nourished. No distress.  Nontoxic and in NAD  HENT:  Head: Normocephalic and atraumatic.  Cysts noted to posterior right parietal scalp. No drainage, erythema, heat to touch.  Eyes: Conjunctivae and EOM are normal. No scleral icterus.  Neck: Normal range of motion.  Pulmonary/Chest: Effort normal. No respiratory distress.  Respirations even and unlabored  Musculoskeletal: Normal range of motion.  Neurological: He is alert and oriented to person, place, and time. He exhibits normal muscle tone. Coordination normal.  Skin: Skin is warm and dry. No rash noted. He is not diaphoretic. No erythema. No pallor.  Psychiatric: He has a normal mood and affect. His behavior is normal.  Nursing note and vitals reviewed.    ED Treatments / Results  Labs (all labs ordered are listed, but only abnormal results are displayed) Labs  Reviewed - No data to display  EKG  EKG Interpretation None       Radiology No results found.  Procedures Procedures (including critical care time)  Medications Ordered in ED Medications  ketorolac (TORADOL) injection 60 mg (60 mg Intramuscular Given 12/20/16 0148)  doxycycline (VIBRA-TABS) tablet 100 mg (100 mg Oral Given 12/20/16 0148)     Initial Impression / Assessment and Plan / ED Course  I have reviewed the triage vital signs and the nursing notes.  Pertinent labs & imaging results that were available during my care of the patient were reviewed by me and considered in my medical decision making (see chart  for details).     26 year old male presents to the emergency department for evaluation of "knots" to his posterior scalp. Physical exam findings consistent with sebaceous cyst without secondary infection. No fevers, nuchal rigidity, or meningismus. Patient given Toradol for headache management. Will start on doxycycline for infection coverage and motrin for headache. Return precautions discussed and provided. Patient discharged in stable condition with no unaddressed concerns.    Final Clinical Impressions(s) / ED Diagnoses   Final diagnoses:  Sebaceous cyst  Acute nonintractable headache, unspecified headache type    New Prescriptions Discharge Medication List as of 12/20/2016  1:02 AM    START taking these medications   Details  doxycycline (VIBRAMYCIN) 100 MG capsule Take 1 capsule (100 mg total) by mouth 2 (two) times daily. Take this medication with a full glass of milk or water, Starting Tue 12/20/2016, Print    ibuprofen (ADVIL,MOTRIN) 600 MG tablet Take 1 tablet (600 mg total) by mouth every 6 (six) hours as needed., Starting Tue 12/20/2016, Print         Antony Madura, PA-C 12/30/16 1610    Gilda Crease, MD 01/03/17 [phone]

## 2018-04-04 ENCOUNTER — Emergency Department (HOSPITAL_COMMUNITY): Payer: No Typology Code available for payment source

## 2018-04-04 ENCOUNTER — Encounter (HOSPITAL_COMMUNITY): Payer: Self-pay | Admitting: Emergency Medicine

## 2018-04-04 ENCOUNTER — Emergency Department (HOSPITAL_COMMUNITY)
Admission: EM | Admit: 2018-04-04 | Discharge: 2018-04-04 | Disposition: A | Discharge status: Home / Self Care | Payer: No Typology Code available for payment source | Attending: Emergency Medicine | Admitting: Emergency Medicine

## 2018-04-04 DIAGNOSIS — S8012XA Contusion of left lower leg, initial encounter: Secondary | ICD-10-CM | POA: Insufficient documentation

## 2018-04-04 DIAGNOSIS — F17210 Nicotine dependence, cigarettes, uncomplicated: Secondary | ICD-10-CM | POA: Diagnosis not present

## 2018-04-04 DIAGNOSIS — Y92018 Other place in single-family (private) house as the place of occurrence of the external cause: Secondary | ICD-10-CM | POA: Diagnosis not present

## 2018-04-04 DIAGNOSIS — J45909 Unspecified asthma, uncomplicated: Secondary | ICD-10-CM | POA: Diagnosis not present

## 2018-04-04 DIAGNOSIS — Y999 Unspecified external cause status: Secondary | ICD-10-CM | POA: Diagnosis not present

## 2018-04-04 DIAGNOSIS — Y9389 Activity, other specified: Secondary | ICD-10-CM | POA: Diagnosis not present

## 2018-04-04 DIAGNOSIS — S8992XA Unspecified injury of left lower leg, initial encounter: Secondary | ICD-10-CM | POA: Diagnosis present

## 2018-04-04 DIAGNOSIS — S80812A Abrasion, left lower leg, initial encounter: Secondary | ICD-10-CM | POA: Diagnosis not present

## 2018-04-04 DIAGNOSIS — Z23 Encounter for immunization: Secondary | ICD-10-CM | POA: Diagnosis not present

## 2018-04-04 MED ORDER — HYDROCODONE-ACETAMINOPHEN 5-325 MG PO TABS
1.0000 | ORAL_TABLET | Freq: Once | ORAL | Status: AC
Start: 2018-04-04 — End: 2018-04-04
  Administered 2018-04-04: 1 via ORAL
  Filled 2018-04-04: qty 1

## 2018-04-04 MED ORDER — TETANUS-DIPHTH-ACELL PERTUSSIS 5-2.5-18.5 LF-MCG/0.5 IM SUSP
0.5000 mL | Freq: Once | INTRAMUSCULAR | Status: AC
  Administered 2018-04-04: 0.5 mL via INTRAMUSCULAR
  Filled 2018-04-04: qty 0.5

## 2018-04-04 MED ORDER — KETOROLAC TROMETHAMINE 60 MG/2ML IM SOLN
60.0000 mg | Freq: Once | INTRAMUSCULAR | Status: AC
  Administered 2018-04-04: 60 mg via INTRAMUSCULAR
  Filled 2018-04-04: qty 2

## 2018-04-04 MED ORDER — IBUPROFEN 600 MG PO TABS: 600.0000 mg | ORAL_TABLET | Freq: Four times a day (QID) | ORAL | 0 refills | Status: AC | PRN

## 2018-04-04 MED ORDER — HYDROCODONE-ACETAMINOPHEN 5-325 MG PO TABS: 2.0000 | ORAL_TABLET | ORAL | 0 refills | Status: AC | PRN

## 2018-04-04 NOTE — ED Notes (Signed)
Patient transported to X-ray 

## 2018-04-04 NOTE — Discharge Instructions (Addendum)
Ice and elevate your leg.  Ibuprofen for pain.  Norco for severe pain only.  Use crutches for 1 to 2 days as needed.  Please follow-up with orthopedics for recheck.  Return as needed.  Watch for any signs of vascular compromise to the foot, if your foot becomes pale, cold, numb, painful, return to the hospital immediately.
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
## 2018-04-04 NOTE — ED Notes (Signed)
Pt verbalized understanding of discharge paperwork, follow-up care and prescriptions.  
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
## 2018-05-16 ENCOUNTER — Emergency Department (HOSPITAL_COMMUNITY): Payer: Self-pay

## 2018-05-16 ENCOUNTER — Other Ambulatory Visit: Payer: Self-pay

## 2018-05-16 ENCOUNTER — Encounter (HOSPITAL_COMMUNITY): Payer: Self-pay

## 2018-05-16 ENCOUNTER — Emergency Department (HOSPITAL_COMMUNITY)
Admission: EM | Admit: 2018-05-16 | Discharge: 2018-05-16 | Disposition: A | Discharge status: Home / Self Care | Payer: Self-pay | Attending: Emergency Medicine | Admitting: Emergency Medicine

## 2018-05-16 DIAGNOSIS — Z79899 Other long term (current) drug therapy: Secondary | ICD-10-CM | POA: Insufficient documentation

## 2018-05-16 DIAGNOSIS — L89892 Pressure ulcer of other site, stage 2: Secondary | ICD-10-CM | POA: Insufficient documentation

## 2018-05-16 DIAGNOSIS — F17210 Nicotine dependence, cigarettes, uncomplicated: Secondary | ICD-10-CM | POA: Insufficient documentation

## 2018-05-16 DIAGNOSIS — J45909 Unspecified asthma, uncomplicated: Secondary | ICD-10-CM | POA: Insufficient documentation

## 2018-05-16 MED ORDER — CEPHALEXIN 500 MG PO CAPS: 500.0000 mg | ORAL_CAPSULE | Freq: Four times a day (QID) | ORAL | 0 refills | Status: AC
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
## 2018-05-16 NOTE — Discharge Instructions (Signed)
Take Keflex until completed.  Please follow-up with Dr. Ulice Bold for further evaluation and treatment of your wound.  Please return the emergency department develop any increasing pain, redness, swelling, red streaking away from the wound, or fever 100.4.

## 2018-05-18 LAB — AEROBIC CULTURE W GRAM STAIN (SUPERFICIAL SPECIMEN)

## 2018-05-19 ENCOUNTER — Telehealth: Payer: Self-pay | Admitting: Emergency Medicine

## 2018-05-19 NOTE — Telephone Encounter (Signed)
Post ED Visit - Positive Culture Follow-up: Successful Patient Follow-Up  Culture assessed and recommendations reviewed by:  []  Enzo Bi, Pharm.D. []  Celedonio Miyamoto, Pharm.D., BCPS AQ-ID []  Garvin Fila, Pharm.D., BCPS []  Georgina Pillion, Pharm.D., BCPS []  [place], [address].D., BCPS, AAHIVP []  Estella Husk, Pharm.D., BCPS, AAHIVP []  Lysle Pearl, PharmD, BCPS []  Phillips Climes, PharmD, BCPS []  Agapito Games, PharmD, BCPS [x]  Vinnie Level, PharmD  Positive aerobic culture  []  Patient discharged without antimicrobial prescription and treatment is now indicated [x]  Organism is resistant to prescribed ED discharge antimicrobial []  Patient with positive blood cultures  Changes discussed with ED provider: Alveria Apley PA  New antibiotic prescription: Bactrim DS, two tablets twice daily x seven days Called to [facility] [phone] [facility])  Contacted patient, date 05/19/2018, time 1405   [employer] 05/19/2018, 2:18 PM
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
## 2018-05-29 ENCOUNTER — Emergency Department (HOSPITAL_COMMUNITY)
Admission: EM | Admit: 2018-05-29 | Discharge: 2018-05-29 | Disposition: A | Discharge status: Home / Self Care | Payer: Self-pay | Attending: Emergency Medicine | Admitting: Emergency Medicine

## 2018-05-29 ENCOUNTER — Encounter (HOSPITAL_COMMUNITY): Payer: Self-pay | Admitting: Emergency Medicine

## 2018-05-29 ENCOUNTER — Emergency Department (HOSPITAL_COMMUNITY): Payer: Self-pay

## 2018-05-29 DIAGNOSIS — L97922 Non-pressure chronic ulcer of unspecified part of left lower leg with fat layer exposed: Secondary | ICD-10-CM | POA: Insufficient documentation

## 2018-05-29 DIAGNOSIS — J45909 Unspecified asthma, uncomplicated: Secondary | ICD-10-CM | POA: Insufficient documentation

## 2018-05-29 DIAGNOSIS — F17210 Nicotine dependence, cigarettes, uncomplicated: Secondary | ICD-10-CM | POA: Insufficient documentation

## 2018-05-29 MED ORDER — SODIUM CHLORIDE 0.9 % EX SOLN: 5.0000 mL | Freq: Two times a day (BID) | CUTANEOUS | 10 refills | Status: AC
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
## 2018-05-29 NOTE — Discharge Instructions (Addendum)
Please see the information and instructions below regarding your visit.  Your diagnoses today include:  1. Ulcer of left lower extremity with fat layer exposed (HCC)     Tests performed today include: See side panel of your discharge paperwork for testing performed today. Vital signs are listed at the bottom of these instructions.   Medications prescribed:    Take any prescribed medications only as prescribed, and any over the counter medications only as directed on the packaging.  Home care instructions:  Please follow any educational materials contained in this packet.   Please clean and pack the wound twice daily.  Do not use peroxide.  Please soak the gauze and sterile saline, packed the wound.  Then apply dry gauze on top of the packed wound.  Next, place a nonadherent dressing pad over top of the wound and wrap with gauze.  This is called a wet-to-dry dressing.  Follow-up instructions: Please follow-up with your primary care provider tomorrow for further evaluation of your symptoms if they are not completely improved.    Return instructions:  Please return to the Emergency Department if you experience worsening symptoms.  Return to the emergency department if the wound is growing rapidly, you have any redness around the wound, or there is pus draining out of the wound. Please return if you have any other emergent concerns.  Additional Information:   Your vital signs today were: BP 139/87 (BP Location: Left Arm)    Pulse 65    Temp 98.4 F (36.9 C) (Oral)    Resp 15    Ht 6' (1.829 m)    Wt 95.3 kg    SpO2 100%    BMI 28.48 kg/m  If your blood pressure (BP) was elevated on multiple readings during this visit above 130 for the top number or above 80 for the bottom number, please have this repeated by your primary care provider within one month. --------------  Thank you for allowing [place] to participate in your care today.

## 2018-05-29 NOTE — ED Notes (Signed)
Wound care performed

## 2018-06-05 ENCOUNTER — Telehealth: Payer: Self-pay

## 2018-06-05 NOTE — Telephone Encounter (Signed)
Message received from Rubie Maid, RN CM noting that the patient needs a hospital follow up appointment @ [facility] .  Call placed to the patient #  [phone] as there is a HFU appointment available 06/20/2018 @ 0930.  Message left requesting a call back to this CM # [phone].  The appointment may/may not be available when /if the patient returns the call.   The phone # for [facility] is on the ED AVS and the patient can call to schedule an appointment when he is available.

## 2019-02-21 ENCOUNTER — Emergency Department (HOSPITAL_COMMUNITY)
Admission: EM | Admit: 2019-02-21 | Discharge: 2019-02-21 | Discharge status: Absent without leave | Payer: No Typology Code available for payment source

## 2019-03-12 ENCOUNTER — Other Ambulatory Visit: Payer: Self-pay

## 2019-03-12 DIAGNOSIS — Z20822 Contact with and (suspected) exposure to covid-19: Secondary | ICD-10-CM

## 2019-03-14 LAB — NOVEL CORONAVIRUS, NAA: SARS-CoV-2, NAA: NOT DETECTED

## 2020-10-22 IMAGING — DX DG TIBIA/FIBULA 2V*L*
3 series · 3 of 3 positions shown · non-contrast
Comparison: 05/16/2018

CLINICAL DATA: Leg infection

EXAM:
LEFT TIBIA AND FIBULA - 2 VIEW

[tibia ap]
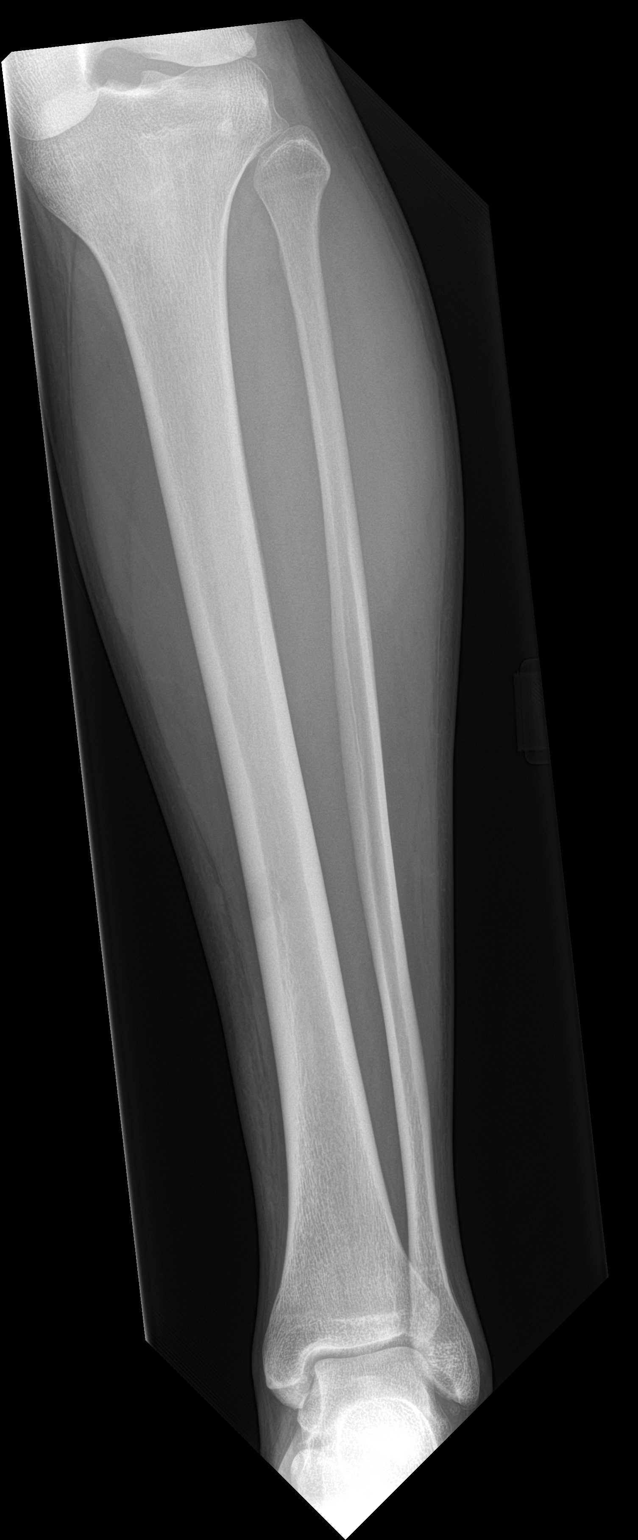

[tibia lat (1 of 2)]
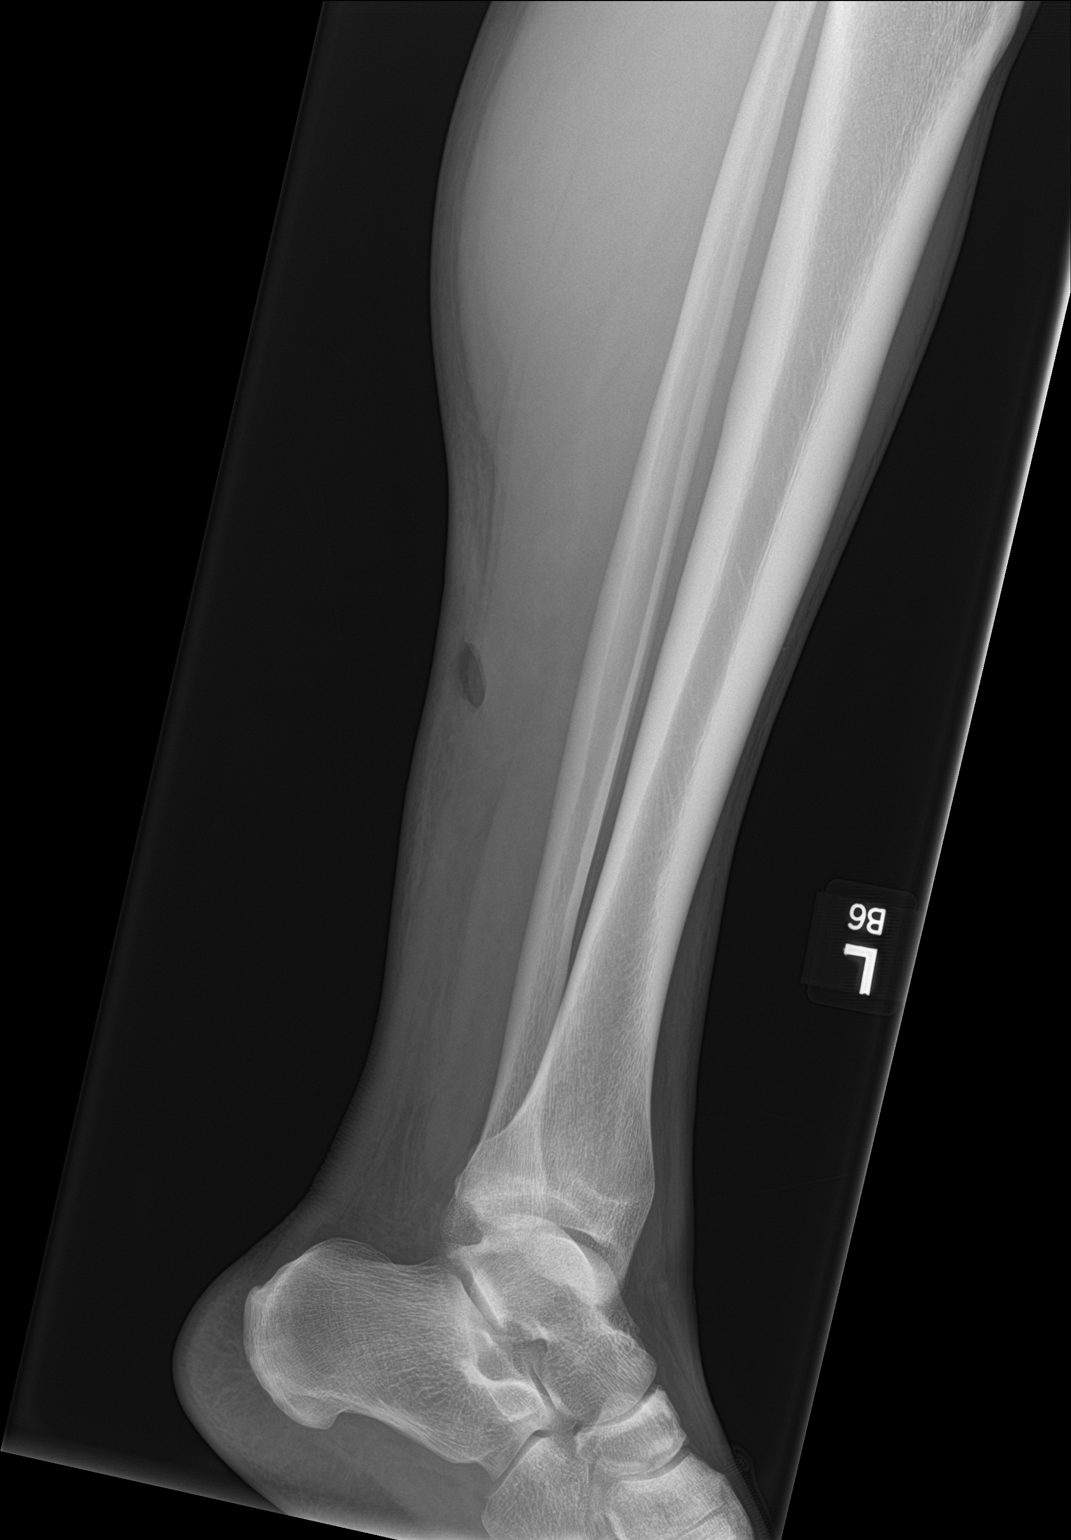

[tibia lat (2 of 2)]
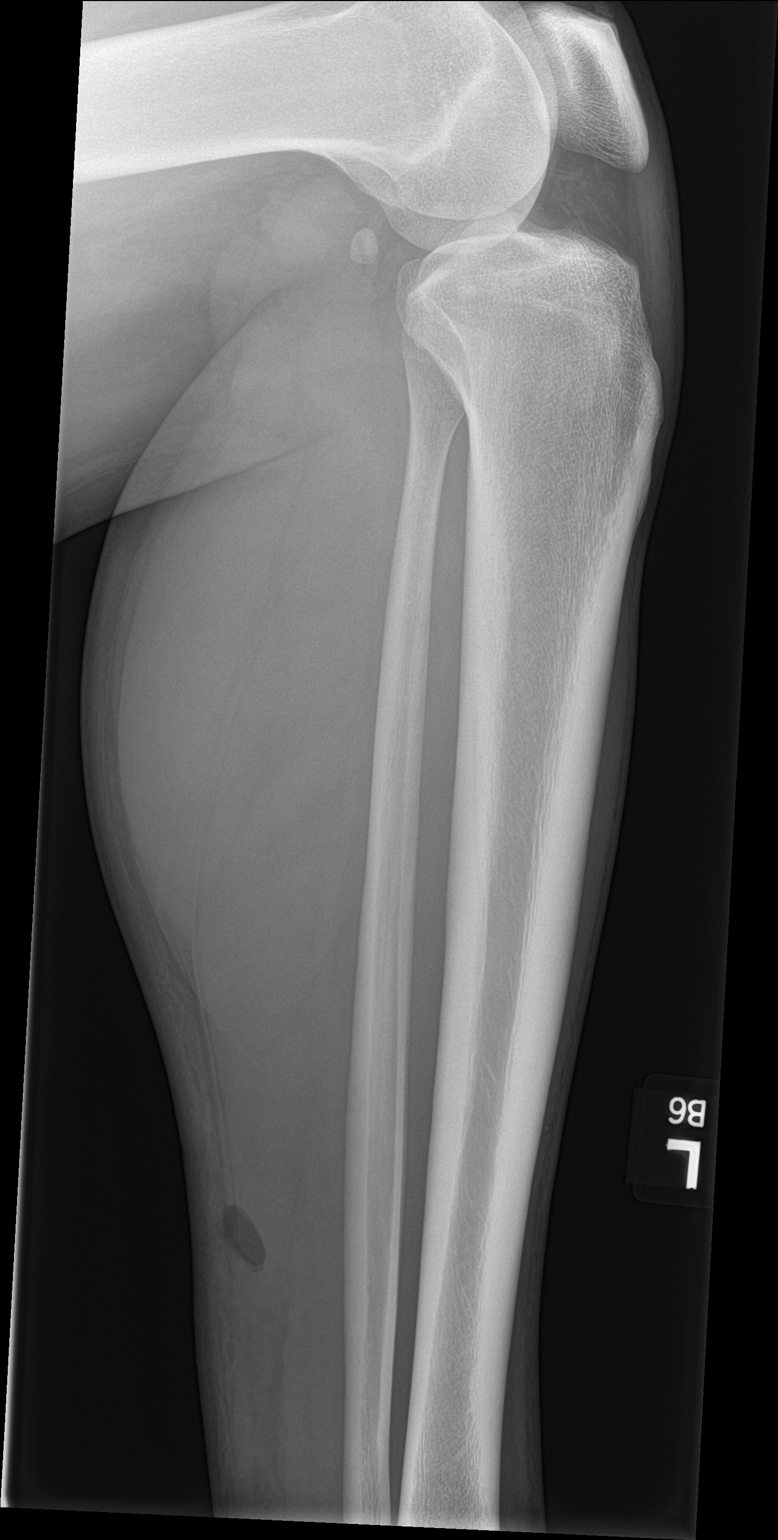

[3 of 3 positions shown; findings below may reference images not displayed]

FINDINGS: No fracture or malalignment. No periostitis. Ulcer or wound
posterior soft tissues of the mid to distal lower leg.
IMPRESSION: 1. No acute osseous abnormality.
2. Soft tissue defect posteriorly within the distal leg consistent
with history of wound
